# Patient Record
Sex: Female | Born: 1975 | Race: Black or African American | Hispanic: No
Health system: Midwestern US, Community
[De-identification: ages and names within clinical notes are randomized; demographics above are authoritative.]

## PROBLEM LIST (undated history)

## (undated) DIAGNOSIS — D573 Sickle-cell trait: Secondary | ICD-10-CM

---

## 2018-04-21 NOTE — ED Notes (Signed)
ED Triage Note       ED Secondary Triage Entered On:  04/21/2018 14:46 EST    Performed On:  04/21/2018 14:45 EST by Pleas Patricia, RN, Lynnda Shields               General Information   Barriers to Learning :   None evident   Last Tetanus :   Greater than 5 years   Influenza Vaccine Status :   Not received   ED Home Meds Section :   Document assessment   [facility] ED Fall Risk Section :   Document assessment   Infectious Disease Documentation :   Document assessment   ED Advance Directives Section :   Document assessment   Pleas Patricia RN, Lynnda Shields - 04/21/2018 14:45 EST   (As Of: 04/21/2018 14:46:11 EST)   Problems(Active)    Sickle cell trait (SNOMED CT  :20100712 )  Name of Problem:   Sickle cell trait ; Recorder:   Patrick North; Confirmation:   Confirmed ; Classification:   Patient Stated ; Code:   [id number] ; Contributor System:   Dietitian ; Last Updated:   04/21/2018 13:40 EST ; Life Cycle Date:   04/21/2018 ; Life Cycle Status:   Active ; Vocabulary:   SNOMED CT          Diagnoses(Active)    Leg pain-swelling  Date:   04/21/2018 ; Diagnosis Type:   Reason For Visit ; Confirmation:   Complaint of ; Clinical Dx:   Leg pain-swelling ; Classification:   Medical ; Clinical Service:   Emergency medicine ; Code:   PNED ; Probability:   0 ; Diagnosis Code:   [id number]             -    Procedure History   (As Of: 04/21/2018 14:46:11 EST)     [facility] Perch Fall Risk Assessment Tool   Hx of falling last 3 months ED Fall :   No   [place]herton, Dorothy Spark - 04/21/2018 14:45 EST   ED Advance Directive   Advance Directive :   No   [place], RN, Lynnda Shields - 04/21/2018 14:45 EST   ID Risk Screen Symptoms   Recent Travel History :   No recent travel   TB Symptom Screen :   No symptoms   C. diff Symptom/History ID :   Neither of the above   Pillager, Dorothy Spark - 04/21/2018 14:45 EST   Med Hx   Medication List   (As Of: 04/21/2018 14:46:11 EST)

## 2018-04-21 NOTE — Discharge Summary (Signed)
ED Clinical Summary                     [facility] [place]  [address]  [place], [place] [id number]  [phone]          PERSON INFORMATION  Name: [name] [name], [name] [name] Age:  [age] Years DOB: [birth year]   Sex: Female Language: English PCP: PCP,  NONE   Marital Status: Single Phone: [phone] Med Service: MED-Medicine   MRN: [id number] Acct# [id number] Arrival: 04/21/2018 13:25:00   Visit Reason: Leg pain-swelling;  L LEG PAIN/SWELLING Acuity: 4 LOS: 000 02:58   [address]    [address] [place] [id number]   Diagnosis:    Muscle tear  Medications:    Medications Administered During Visit:              Allergies      No Known Medication Allergies      Major Tests and Procedures:  The following procedures and tests were performed during your ED visit.  COMMON PROCEDURES%>  COMMON PROCEDURES COMMENTS%>                PROVIDER INFORMATION               Provider Role Assigned Irven CoeUnassigned   MAFFEO-PA-C, HEATHER N ED MidLevel 04/21/2018 14:37:40        Attending Physician:  MAFFEO-PA-C,  HEATHER N      Admit Doc  MAFFEO-PA-C,  HEATHER N     Consulting Doc       VITALS INFORMATION  Vital Sign Triage Latest   Temp Oral ORAL_1%> ORAL%>   Temp Temporal TEMPORAL_1%> TEMPORAL%>   Temp Intravascular INTRAVASCULAR_1%> INTRAVASCULAR%>   Temp Axillary AXILLARY_1%> AXILLARY%>   Temp Rectal RECTAL_1%> RECTAL%>   02 Sat 98 % 98 %   Respiratory Rate RATE_1%> RATE%>   Peripheral Pulse Rate PULSE RATE_1%> PULSE RATE%>   Apical Heart Rate HEART RATE_1%> HEART RATE%>   Blood Pressure BLOOD PRESSURE_1%>/ BLOOD PRESSURE_1%>83 mmHg BLOOD PRESSURE%> / BLOOD PRESSURE%>83 mmHg                 Immunizations      No Immunizations Documented This Visit          DISCHARGE INFORMATION   Discharge Disposition: H Outpt-Sent Home   Discharge Location:  Home   Discharge Date and Time:  04/21/2018 16:23:16   ED Checkout Date and Time:  04/21/2018 16:23:16     DEPART REASON INCOMPLETE INFORMATION               Depart Action  Incomplete Reason   Interactive View/I&O Recently assessed               Problems      No Problems Documented              Smoking Status      No Smoking Status Documented         PATIENT EDUCATION INFORMATION  Instructions:     Form - Return To Work Social worker(Custom); Muscle Strain, Easy-to-Read     Follow up:                   With: Address: When:   Follow up with primary care provider  Within 1 week   Comments:   Take over-the-counter ibuprofen or Aleve as directed for pain   Ice to affected area   elevation and compression stockings  Follow up with your primary  care doctor call [phone] for referral  Return to the emergency department for any new, worsening or concerning symptoms              ED PROVIDER DOCUMENTATION     Patient:   [name] [name], [name] [name]             MRN: [id number]            FIN: [id number]               Age:   43 years     Sex:  Female     DOB:  February 01, 1976   Associated Diagnoses:   Muscle tear   Author:   MAFFEO-PA-C,  Ledell Peoples      Basic Information   Time seen: Provider Seen (ST)   ED Provider/Time:    MAFFEO-PA-C,  HEATHER N / 04/21/2018 14:37  .   Additional information: Chief Complaint from Nursing Triage Note   Chief Complaint  Chief Complaint: pt c/o swelling, tightness and burning in L calf and knee, and tingling has also started in R toes (04/21/18 13:35:00).      History of Present Illness   Pt is a 43 y/o f who presents to the ED with c/o left leg pain. Patient states thatthe symptoms of been going on for several days. She reports significant swelling to the left leg.pain described as a burning to the calf area. There is no inciting injury, fall, trauma. She complains paresthesias to the areas. She has no other complaints at this time..        Review of Systems   Constitutional symptoms:  No fever,    Skin symptoms:  No rash,    ENMT symptoms:  No sore throat,    Respiratory symptoms:  No cough,    Cardiovascular symptoms:  No chest pain,    Gastrointestinal symptoms:  No abdominal pain,     Genitourinary symptoms:  No dysuria,    Musculoskeletal symptoms:  Muscle pain, No back pain,    Neurologic symptoms:  No headache,              Additional review of systems information: All other systems reviewed and otherwise negative.      Health Status   Allergies:    Allergic Reactions (Selected)  No Known Medication Allergies.      Past Medical/ Family/ Social History   Medical history:    No active or resolved past medical history items have been selected or recorded., Reviewed as documented in chart.   Surgical history:    No active procedure history items have been selected or recorded., Reviewed as documented in chart.   Family history: Reviewed as documented in chart.   Social history: Reviewed as documented in chart.   Problem list:    Active Problems (1)  Sickle cell trait   , per nurse's notes.      Physical Examination               Vital Signs   Vital Signs   04/21/2018 13:35 EST Systolic Blood Pressure 133 mmHg    Diastolic Blood Pressure 83 mmHg    Temperature Oral 36.7 degC    Heart Rate Monitored 87 bpm    Respiratory Rate 18 br/min    SpO2 98 %    Measurements   04/21/2018 13:40 EST Body Mass Index est meas 43.62 kg/m2    Body Mass Index Measured 43.62 kg/m2   04/21/2018 13:35 EST Height/Length Measured 157.5 cm  Weight Dosing 108.2 kg    General:  Alert, no acute distress.    Skin:  Warm, dry.    Head:  Normocephalic, atraumatic.    Neck:  Supple.   Eye:  Pupils are equal, round and reactive to light.   Ears, nose, mouth and throat:  Oral mucosa moist.   Cardiovascular:  Regular rate and rhythm, No murmur, Normal peripheral perfusion.    Respiratory:  Lungs are clear to auscultation, respirations are non-labored, breath sounds are equal, Symmetrical chest wall expansion.    Gastrointestinal:  Soft, Nontender.    Back:  Nontender, Normal range of motion.    Musculoskeletal:  Normal ROM, left lower leg has swelling when compared to the right, tenderness on palpation to the left calf, 2+ dorsalis  pedis pulse.    Neurological:  Alert and oriented to person, place, time, and situation, No focal neurological deficit observed.    Psychiatric:  Cooperative, appropriate mood & affect.       Medical Decision Making   Differential Diagnosis:  DVT, cellulitis, muscle strain, knee injury.   Documents reviewed:  Emergency department nurses' notes.   Notes:  patient in the emergency department for evaluation of left knee swelling. Vascular ultrasound negative for acute DVT however technician informs me that it appears the patient has a proximal calf tear. Discussed this with the patient. Recommended compression stockings, Aleve or ibuprofen, ice and follow-up with primary care doctor. She was advised to return to the emergency department for any new or worsening symptoms. Patient is in agreement with this plan of care. They express understanding and have no further questions at this time. Patient is afebrile, nontoxic appearing and in no acute distress. Discharged to home in stable condition..      Impression and Plan   Diagnosis   Muscle tear (ICD10-CM T14.8XXA, Discharge, Medical)   Plan   Condition: Improved.    Disposition: Discharged: Time  04/21/2018 16:06:00, to home.    Patient was given the following educational materials: Muscle Strain, Easy-to-Read, Muscle Strain, Easy-to-Read, Form - Return To Work Social worker).    Follow up with: Follow up with primary care provider Within 1 week Take over-the-counter ibuprofen or Aleve as directed for pain  Ice to affected area  elevation and compression stockings  Follow up with your primary care doctor call [phone] for referral  Return to the emergency department for any new, worsening or concerning symptoms, Follow up with primary care provider Within 1 week Take over-the-counter ibuprofen or Aleve as directed for pain  Ice to affected area  elevation and compression stockings  Follow up with your primary care doctor call [phone] for referral  Return to the  emergency department for any new, worsening or concerning symptoms.    Counseled: I had a detailed discussion with the patient and/or guardian regarding the historical points/exam findings supporting the discharge diagnosis and need for outpatient followup. Discussed the need to return to the ER if symptoms persist/worsen, or for any questions/concerns that arise at home.

## 2018-04-21 NOTE — ED Notes (Signed)
 ED Patient Education Note     Patient Education Materials Follows:  Musculoskeletal     Muscle Strain    A muscle strain (pulled muscle) happens when a muscle is stretched beyond normal length. It happens when a sudden, violent force stretches your muscle too far. Usually, a few of the fibers in your muscle are torn. Muscle strain is common in athletes. Recovery usually takes 1?2 weeks. Complete healing takes 5?6 weeks.       HOME CARE     Follow the PRICE method of treatment to help your injury get better. Do this the first 2?3 days after the injury:    ? Protect. Protect the muscle to keep it from getting injured again.    ? Rest. Limit your activity and rest the injured body part.    ? Ice. Put ice in a plastic bag. Place a towel between your skin and the bag. Then, apply the ice and leave it on from 15?20 minutes each hour. After the third day, switch to moist heat packs.     ? Compression. Use a splint or elastic bandage on the injured area for comfort. Do not put it on too tightly.    ? Elevate. Keep the injured body part above the level of your heart.      Only take medicine as told by your doctor.     Warm up before doing exercise to prevent future muscle strains.    GET HELP IF:     You have more pain or puffiness (swelling) in the injured area.     You feel numbness, tingling, or notice a loss of strength in the injured area.     MAKE SURE YOU:     Understand these instructions.      Will watch your condition.     Will get help right away if you are not doing well or get worse.    This information is not intended to replace advice given to you by your health care provider. Make sure you discuss any questions you have with your health care provider.    Document Released: 01/14/2008 Document Revised: 01/25/2013 Document Reviewed: 11/03/2012  Elsevier Interactive Patient Education ?2016 [employer]                 Return to Work  _______[name]_____________________________________was treated at  our facility.  INJURY OR ILLNESS WAS:  _____Work related  __x___Not work related  _____Undetermined if work related  RETURN TO WORK  . Employee may return to work on __1/6/2020__________________.    Show this Return to Work statement to your supervisor at work as soon as possible. Your employer should be aware of your condition and can help with the necessary work activity restrictions. If you wish to return to work sooner than the date that is listed above, or if you have further problems that make it difficult for you to return at that time, please call our clinic or your health care provider.    ___Heather Maffeo PA-C______________________________________  Health Care Provider Name (printed)  _________________________________________  Health Care Provider (signature)   ______1/2/2020__________________________________  Date  This information is not intended to replace advice given to you by your health care provider. Make sure you discuss any questions you have with your health care provider.  Document Released: 04/06/2005 Document Revised: 04/27/2014 Document Reviewed: 11/03/2013  Elsevier Interactive Patient Education [employer]

## 2018-04-21 NOTE — ED Provider Notes (Signed)
General Medical Problem *ED        Patient:   [name] [name], [name] [name]             MRN: [id number]            FIN: [id number]               Age:   43 years     Sex:  Female     DOB:  25-Feb-1976   Associated Diagnoses:   Muscle tear   Author:   Ladina Shutters-PA-C,  Ledell Peoples      Basic Information   Time seen: Provider Seen (ST)   ED Provider/Time:    Rhoda Waldvogel-PA-C,  Sohrab Keelan N / 04/21/2018 14:37  .   Additional information: Chief Complaint from Nursing Triage Note   Chief Complaint  Chief Complaint: pt c/o swelling, tightness and burning in L calf and knee, and tingling has also started in R toes (04/21/18 13:35:00).      History of Present Illness   Pt is a 43 y/o f who presents to the ED with c/o left leg pain. Patient states thatthe symptoms of been going on for several days. She reports significant swelling to the left leg.pain described as a burning to the calf area. There is no inciting injury, fall, trauma. She complains paresthesias to the areas. She has no other complaints at this time..        Review of Systems   Constitutional symptoms:  No fever,    Skin symptoms:  No rash,    ENMT symptoms:  No sore throat,    Respiratory symptoms:  No cough,    Cardiovascular symptoms:  No chest pain,    Gastrointestinal symptoms:  No abdominal pain,    Genitourinary symptoms:  No dysuria,    Musculoskeletal symptoms:  Muscle pain, No back pain,    Neurologic symptoms:  No headache,              Additional review of systems information: All other systems reviewed and otherwise negative.      Health Status   Allergies:    Allergic Reactions (Selected)  No Known Medication Allergies.      Past Medical/ Family/ Social History   Medical history:    No active or resolved past medical history items have been selected or recorded., Reviewed as documented in chart.   Surgical history:    No active procedure history items have been selected or recorded., Reviewed as documented in chart.   Family history: Reviewed as documented in chart.   Social  history: Reviewed as documented in chart.   Problem list:    Active Problems (1)  Sickle cell trait   , per nurse's notes.      Physical Examination               Vital Signs   Vital Signs   04/21/2018 13:35 EST Systolic Blood Pressure 133 mmHg    Diastolic Blood Pressure 83 mmHg    Temperature Oral 36.7 degC    Heart Rate Monitored 87 bpm    Respiratory Rate 18 br/min    SpO2 98 %    Measurements   04/21/2018 13:40 EST Body Mass Index est meas 43.62 kg/m2    Body Mass Index Measured 43.62 kg/m2   04/21/2018 13:35 EST Height/Length Measured 157.5 cm    Weight Dosing 108.2 kg    General:  Alert, no acute distress.    Skin:  Warm, dry.    Head:  Normocephalic, atraumatic.    Neck:  Supple.   Eye:  Pupils are equal, round and reactive to light.   Ears, nose, mouth and throat:  Oral mucosa moist.   Cardiovascular:  Regular rate and rhythm, No murmur, Normal peripheral perfusion.    Respiratory:  Lungs are clear to auscultation, respirations are non-labored, breath sounds are equal, Symmetrical chest wall expansion.    Gastrointestinal:  Soft, Nontender.    Back:  Nontender, Normal range of motion.    Musculoskeletal:  Normal ROM, left lower leg has swelling when compared to the right, tenderness on palpation to the left calf, 2+ dorsalis pedis pulse.    Neurological:  Alert and oriented to person, place, time, and situation, No focal neurological deficit observed.    Psychiatric:  Cooperative, appropriate mood & affect.       Medical Decision Making   Differential Diagnosis:  DVT, cellulitis, muscle strain, knee injury.   Documents reviewed:  Emergency department nurses' notes.   Notes:  patient in the emergency department for evaluation of left knee swelling. Vascular ultrasound negative for acute DVT however technician informs me that it appears the patient has a proximal calf tear. Discussed this with the patient. Recommended compression stockings, Aleve or ibuprofen, ice and follow-up with primary care doctor. She was  advised to return to the emergency department for any new or worsening symptoms. Patient is in agreement with this plan of care. They express understanding and have no further questions at this time. Patient is afebrile, nontoxic appearing and in no acute distress. Discharged to home in stable condition..      Impression and Plan   Diagnosis   Muscle tear (ICD10-CM T14.8XXA, Discharge, Medical)   Plan   Condition: Improved.    Disposition: Discharged: Time  04/21/2018 16:06:00, to home.    Patient was given the following educational materials: Muscle Strain, Easy-to-Read, Muscle Strain, Easy-to-Read, Form - Return To Work Social worker).    Follow up with: Follow up with primary care provider Within 1 week Take over-the-counter ibuprofen or Aleve as directed for pain  Ice to affected area  elevation and compression stockings  Follow up with your primary care doctor call [phone] for referral  Return to the emergency department for any new, worsening or concerning symptoms, Follow up with primary care provider Within 1 week Take over-the-counter ibuprofen or Aleve as directed for pain  Ice to affected area  elevation and compression stockings  Follow up with your primary care doctor call [phone] for referral  Return to the emergency department for any new, worsening or concerning symptoms.    Counseled: I had a detailed discussion with the patient and/or guardian regarding the historical points/exam findings supporting the discharge diagnosis and need for outpatient followup. Discussed the need to return to the ER if symptoms persist/worsen, or for any questions/concerns that arise at home.    Signature Line     Electronically Signed on 04/21/2018 04:08 PM EST   ________________________________________________   Fruma [place]-PA-C,  Ledell Peoples      Electronically Signed on 04/21/2018 11:16 PM EST   ________________________________________________   PRICE-MD,  Kirke Corin

## 2018-04-21 NOTE — ED Notes (Signed)
 ED Patient Summary       ;      [facility] Emergency Department  [address], [place] [address]  [phone]  Discharge Instructions (Patient)  _______________________________________    Name: [name] , [name]  DOB: 09-15-1975 MRN: [id number] FIN: NBR%[phone]  Reason For Visit: Leg pain-swelling;  L LEG PAIN/SWELLING  Final Diagnosis: Muscle tear    Visit Date: 04/21/2018 13:25:00  Address: [address] [place] [address]  Phone: [phone]    Primary Care Provider:  Name: PCP,  NONE  Phone:      Emergency Department Providers:         Primary Physician:           [facility] would like to thank you for allowing us  to assist you with your healthcare needs. The following includes patient education materials and information regarding your injury/illness.    Follow-up Instructions: You were treated today on an emergency basis, it may be wise to contact your primary care provider to notify them of your visit today. You may have been referred to your regular doctor or a specialist, please follow up as instructed. If your condition worsens or you can't get in to see the doctor, contact the Emergency Department.              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  INJURY OR ILLNESS WAS:  _____Work related  __x___Not work related  _____Undetermined if work related  RETURN TO WORK  . Employee may return to work on  __1/6/2020__________________.    Show this Return to Work statement to your supervisor at work as soon as possible. Your employer should be aware of your condition and can help with the necessary work activity restrictions. If you wish to return to work sooner than the date that is listed above, or if you have further problems that make it difficult for you to return at that time, please call our clinic or your health care provider.    ___Heather Maffeo PA-C______________________________________  Health Care Provider Name (printed)  _________________________________________  Health Care Provider (signature)   ______1/2/2020__________________________________  Date  This information is not intended to replace advice given to you by your health care provider. Make sure you discuss any questions you have with your health care provider.  Document Released: 04/06/2005 Document Revised: 04/27/2014 Document Reviewed: 11/03/2013  Elsevier Interactive Patient Education 2016 [employer]         Muscle Strain    A muscle strain (pulled muscle) happens when a muscle is stretched beyond normal length. It happens when a sudden, violent force stretches your muscle too far. Usually, a few of the fibers in your muscle are torn. Muscle strain is common in athletes. Recovery usually takes 1?2 weeks. Complete healing takes 5?6 weeks.       HOME CARE     Follow  the PRICE method of treatment to help your injury get better. Do this the first 2?3 days after the injury:    ? Protect. Protect the muscle to keep it from getting injured again.    ? Rest. Limit your activity and rest the injured body part.    ? Ice. Put ice in a plastic bag. Place a towel between your skin and the bag. Then, apply the ice and leave it on from 15?20 minutes each hour. After the third day, switch to moist heat packs.     ? Compression. Use a splint or elastic bandage on the injured area for comfort. Do not put it on too tightly.    ? Elevate. Keep the injured  body part above the level of your heart.      Only take medicine as told by your doctor.     Warm up before doing exercise to prevent future muscle strains.    GET HELP IF:     You have more pain or puffiness (swelling) in the injured area.     You feel numbness, tingling, or notice a loss of strength in the injured area.     MAKE SURE YOU:     Understand these instructions.      Will watch your condition.     Will get help right away if you are not doing well or get worse.    This information is not intended to replace advice given to you by your health care provider. Make sure you discuss any questions you have with your health care provider.    Document Released: 01/14/2008 Document Revised: 01/25/2013 Document Reviewed: 11/03/2012  Elsevier Interactive Patient Education ?2016 Elsevier Inc.        Allergy Info: No Known Medication Allergies    Medication Information:  [facility] provided you with a complete list of medications post discharge, if you have been instructed to stop taking a medication please ensure you also follow up with this information to your Primary Care Physician. Unless otherwise noted, patient will continue to take medications as prescribed prior to the Emergency Room visit. Any specific questions regarding your chronic medications and dosages should be discussed with your physician(s) and pharmacist.    Medications Administered During Visit:      Major Tests and Procedures:  The following procedures and tests were performed during your ED visit.  PROCEDURES%>  PROCEDURES COMMENTS%>          ---------------------------------------------------------------------------------------------------------------------  [facility] allows you to manage your health, view your test results, and retrieve your discharge documents from your hospital stay securely and conveniently from your computer.    To begin the enrollment process, visit [email or web address] Click on "Sign up now"  under [facility].      Comment:

## 2018-04-21 NOTE — ED Notes (Signed)
ED Triage Note       ED Triage Adult Entered On:  04/21/2018 13:40 EST    Performed On:  04/21/2018 13:35 EST by Shirlean Kelly H               Triage   Chief Complaint :   pt c/o swelling, tightness and burning in L calf and knee, and tingling has also started in R toes   Numeric Rating Pain Scale :   0 = No pain   [place] Mode of Arrival :   Private vehicle   Temperature Oral :   36.7 degC(Converted to: 98.1 degF)    Heart Rate Monitored :   87 bpm   Respiratory Rate :   18 br/min   Systolic Blood Pressure :   133 mmHg   Diastolic Blood Pressure :   83 mmHg   SpO2 :   98 %   Oxygen Therapy :   Room air   Patient presentation :   None of the above   Chief Complaint or Presentation suggest infection :   No   Weight Dosing :   108.2 kg(Converted to: 238 lb 9 oz)    Height :   157.5 cm(Converted to: 5 ft 2 in)    Body Mass Index Dosing :   44 kg/m2   ED General Section :   Document assessment   Pregnancy Status :   Patient denies   Patrick North - 04/21/2018 13:35 EST   DCP GENERIC CODE   Tracking Acuity :   4   Tracking Group :   ED [id number] [employer]   Patrick North - 04/21/2018 13:35 EST   Last Menstrual Period :   04/16/2018 EST   ED Allergies Section :   Document assessment   ED Reason for Visit Section :   Document assessment   Patrick North - 04/21/2018 13:35 EST   Allergies   (As Of: 04/21/2018 13:40:33 EST)   Allergies (Active)   No Known Medication Allergies  Estimated Onset Date:   Unspecified ; Created By:   Patrick North; Reaction Status:   Active ; Category:   Drug ; Substance:   No Known Medication Allergies ; Type:   Allergy ; Updated By:   Patrick North; Reviewed Date:   04/21/2018 13:39 EST        Psycho-Social   Last 3 mo, thoughts killing self/others :   Patient denies   Patrick North - 04/21/2018 13:35 EST   ED Reason for Visit   (As Of: 04/21/2018 13:40:33 EST)   Problems(Active)    Sickle cell trait (SNOMED CT  :27517001 )  Name of Problem:   Sickle cell trait ; Recorder:   Patrick North;  Confirmation:   Confirmed ; Classification:   Patient Stated ; Code:   [id number] ; Contributor System:   Dietitian ; Last Updated:   04/21/2018 13:40 EST ; Life Cycle Date:   04/21/2018 ; Life Cycle Status:   Active ; Vocabulary:   SNOMED CT          Diagnoses(Active)    Leg pain-swelling  Date:   04/21/2018 ; Diagnosis Type:   Reason For Visit ; Confirmation:   Complaint of ; Clinical Dx:   Leg pain-swelling ; Classification:   Medical ; Clinical Service:   Emergency medicine ; Code:   PNED ; Probability:   0 ; Diagnosis  Code:   [id number] EE

## 2020-06-26 ENCOUNTER — Encounter (HOSPITAL_COMMUNITY): Payer: Self-pay | Admitting: Emergency Medicine

## 2020-06-26 ENCOUNTER — Other Ambulatory Visit: Payer: Self-pay

## 2020-06-26 ENCOUNTER — Emergency Department (HOSPITAL_COMMUNITY)
Admission: EM | Admit: 2020-06-26 | Discharge: 2020-06-26 | Disposition: A | Discharge status: Home / Self Care | Payer: Self-pay | Attending: Emergency Medicine | Admitting: Emergency Medicine

## 2020-06-26 ENCOUNTER — Emergency Department (HOSPITAL_COMMUNITY): Payer: Self-pay

## 2020-06-26 DIAGNOSIS — M25532 Pain in left wrist: Secondary | ICD-10-CM | POA: Insufficient documentation

## 2020-06-26 DIAGNOSIS — F17210 Nicotine dependence, cigarettes, uncomplicated: Secondary | ICD-10-CM | POA: Insufficient documentation

## 2020-06-26 HISTORY — DX: Sickle-cell trait: D57.3

## 2020-06-26 MED ORDER — IBUPROFEN 400 MG PO TABS
600.0000 mg | ORAL_TABLET | Freq: Once | ORAL | Status: AC
  Administered 2020-06-26: 600 mg via ORAL
  Filled 2020-06-26: qty 2

## 2020-06-26 NOTE — ED Triage Notes (Addendum)
Pt to the ED with c/o Left wrist pain.

## 2020-06-26 NOTE — ED Provider Notes (Signed)
[facility] EMERGENCY DEPARTMENT Provider Note   CSN: [id number] Arrival date & time: 06/26/20  1211     History Chief Complaint  Patient presents with  . Wrist Pain    Holly Rowe is a 45 y.o. female.  Holly Rowe is a 45 y.o. female with a history of sickle cell trait, who presents to the ED for evaluation of left wrist pain.  Patient states that a few weeks ago she was living with a woman who had fallen, she tried to help her up they both fell back to the floor and she thinks she may have landed funny on her wrist or gotten her wrist caught underneath her.  Since then she has been having pain in the left wrist, with time it was starting to slowly improve, but 3 days ago she moved back in with her daughter who has 2 small children that she has been helping with.  She has been picking them up and helping care for them and with that has started to notice the pain in her left wrist has gotten a lot worse again.  It is worse with movement, primarily flexion or extension and last night while she was sleeping she thinks she wants to move her wrist in a certain way and it caused a sharp pain that woke her from sleep.  She has not noticed any swelling or skin changes.  No numbness tingling or weakness.  Today she wrapped it with an Ace wrap for the first time to provide some support.  Tried 1 dose of 400 mg ibuprofen a few days ago with minimal relief, has not tried anything else to treat symptoms.  No other aggravating relieving factors.        Past Medical History:  Diagnosis Date  . Sickle cell trait (HCC)     There are no problems to display for this patient.   History reviewed. No pertinent surgical history.   OB History   No obstetric history on file.     History reviewed. No pertinent family history.  Social History   Tobacco Use  . Smoking status: Current Every Day Smoker    Packs/day: 0.50    Types: Cigarettes  . Smokeless tobacco: Never Used  Vaping  Use  . Vaping Use: Never used  Substance Use Topics  . Alcohol use: Not Currently  . Drug use: Not Currently    Home Medications Prior to Admission medications   Not on File    Allergies    Patient has no allergy information on record.  Review of Systems   Review of Systems  Constitutional: Negative for chills and fever.  Musculoskeletal: Positive for arthralgias. Negative for joint swelling.  Skin: Negative for color change and wound.  Neurological: Negative for weakness and numbness.    Physical Exam Updated Vital Signs BP (!) 122/50 (BP Location: Right Arm)   Temp 98.3 F (36.8 C) (Oral)   Resp 17   Ht 5\' 1"  (1.549 m)   Wt 112.3 kg   LMP  (LMP Unknown)   SpO2 100%   BMI 46.76 kg/m   Physical Exam Vitals and nursing note reviewed.  Constitutional:      General: She is not in acute distress.    Appearance: Normal appearance. She is well-developed and well-nourished. She is not diaphoretic.     Comments: Well-appearing and in no distress   HENT:     Head: Normocephalic and atraumatic.  Eyes:     General:  Right eye: No discharge.        Left eye: No discharge.  Pulmonary:     Effort: Pulmonary effort is normal. No respiratory distress.  Musculoskeletal:        General: Tenderness present. No swelling.     Comments: Tenderness present over the radial aspect of the left wrist with no palpable swelling or deformity, no overlying erythema or warmth.  Patient able to move the thumb and all fingers without worsening of pain, pain worsened with flexion or extension of the wrist as well as rotation.  2+ radial pulse and good cap refill.  No tenderness or pain with range of motion of the elbow.  Normal sensation and strength.  Skin:    General: Skin is warm and dry.  Neurological:     Mental Status: She is alert and oriented to person, place, and time.     Coordination: Coordination normal.  Psychiatric:        Mood and Affect: Mood and affect and mood normal.         Behavior: Behavior normal.     ED Results / Procedures / Treatments   Labs (all labs ordered are listed, but only abnormal results are displayed) Labs Reviewed - No data to display  EKG None  Radiology DG Wrist Complete Left  Result Date: 06/26/2020 CLINICAL DATA:  Fall.  Wrist pain. EXAM: LEFT WRIST - COMPLETE 3+ VIEW COMPARISON:  None. FINDINGS: There is no evidence of fracture or dislocation. There is no evidence of arthropathy or other focal bone abnormality. Soft tissues are unremarkable. IMPRESSION: Negative left wrist radiographs. Electronically Signed   By: Marin Roberts M.D.   On: 06/26/2020 14:38    Procedures Procedures   Medications Ordered in ED Medications  ibuprofen (ADVIL) tablet 600 mg (600 mg Oral Given 06/26/20 1338)    ED Course  I have reviewed the triage vital signs and the nursing notes.  Pertinent labs & imaging results that were available during my care of the patient were reviewed by me and considered in my medical decision making (see chart for details).    MDM Rules/Calculators/A&P                         Patient X-Ray negative for obvious fracture or dislocation. Suspect wrist sprain. Pain managed in ED. Pt advised to follow up with orthopedics if symptoms persist. Patient given brace while in ED, conservative therapy recommended and discussed. Patient will be dc home & is agreeable with above plan.  Final Clinical Impression(s) / ED Diagnoses Final diagnoses:  Left wrist pain    Rx / DC Orders ED Discharge Orders    None       Legrand Rams 06/26/20 1628    Vanetta Mulders, MD 07/09/20 [phone]

## 2020-06-26 NOTE — Discharge Instructions (Signed)
Your x-ray looks good, suspect you have a wrist sprain that was healing and now worsened after increased activity with picking up your grandchildren.  Take naproxen (Aleve) every 12 hours to help with pain and inflammation, you can use Tylenol in addition to this.  You can also apply ice.  Use wrist brace at all times, even when sleeping.  Follow-up with orthopedics if pain is not improving.

## 2022-07-02 IMAGING — DX DG WRIST COMPLETE 3+V*L*
4 series · 4 of 4 positions shown · non-contrast
Comparison: None.

CLINICAL DATA: Fall.  Wrist pain.

EXAM:
LEFT WRIST - COMPLETE 3+ VIEW

[wrist ap (1 of 2)]
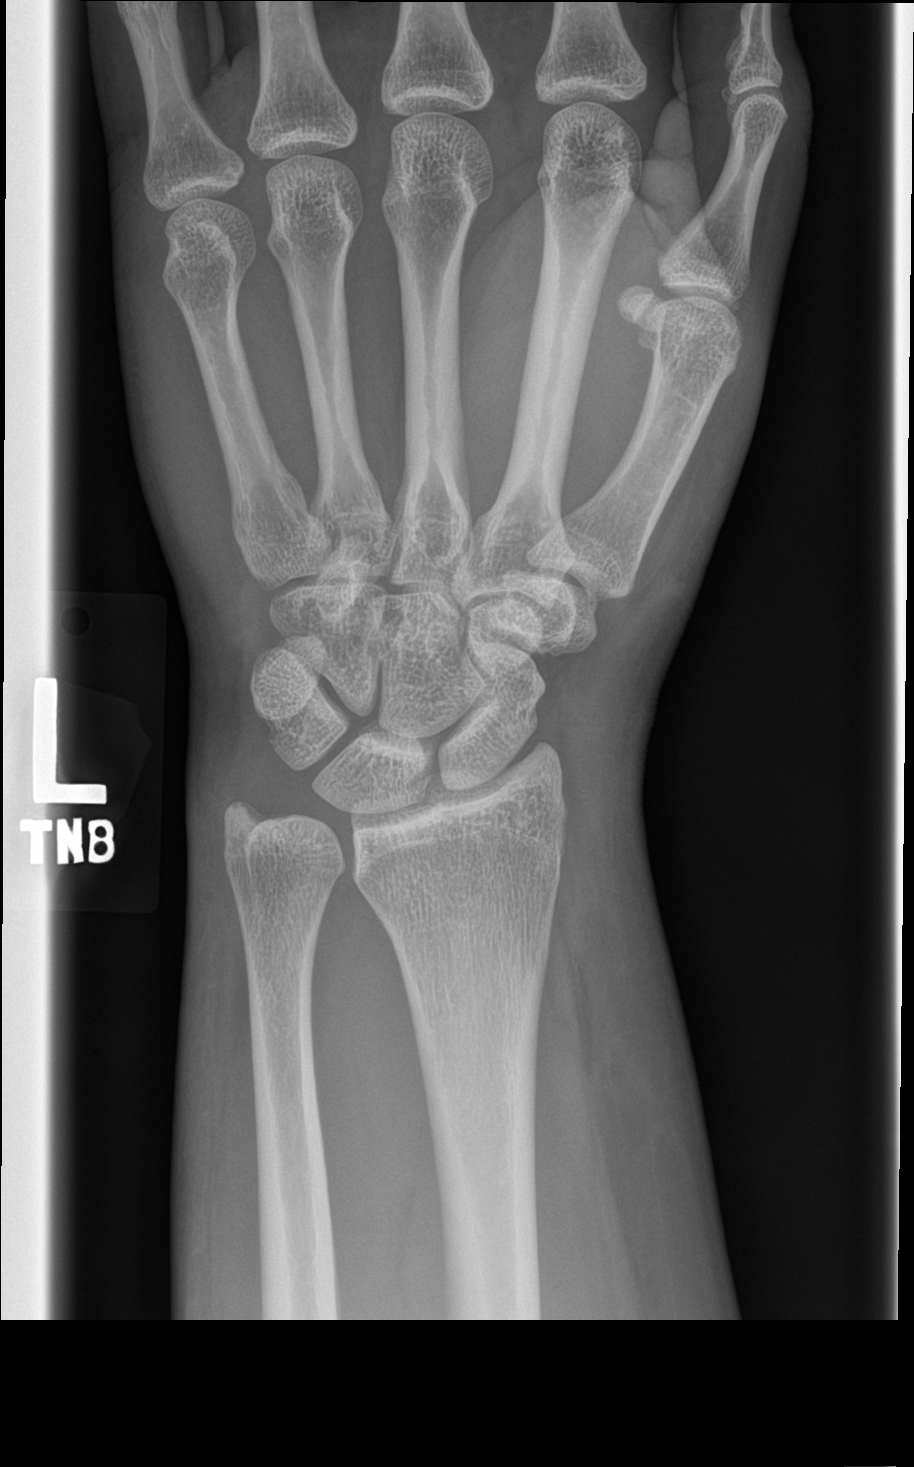

[wrist obl]
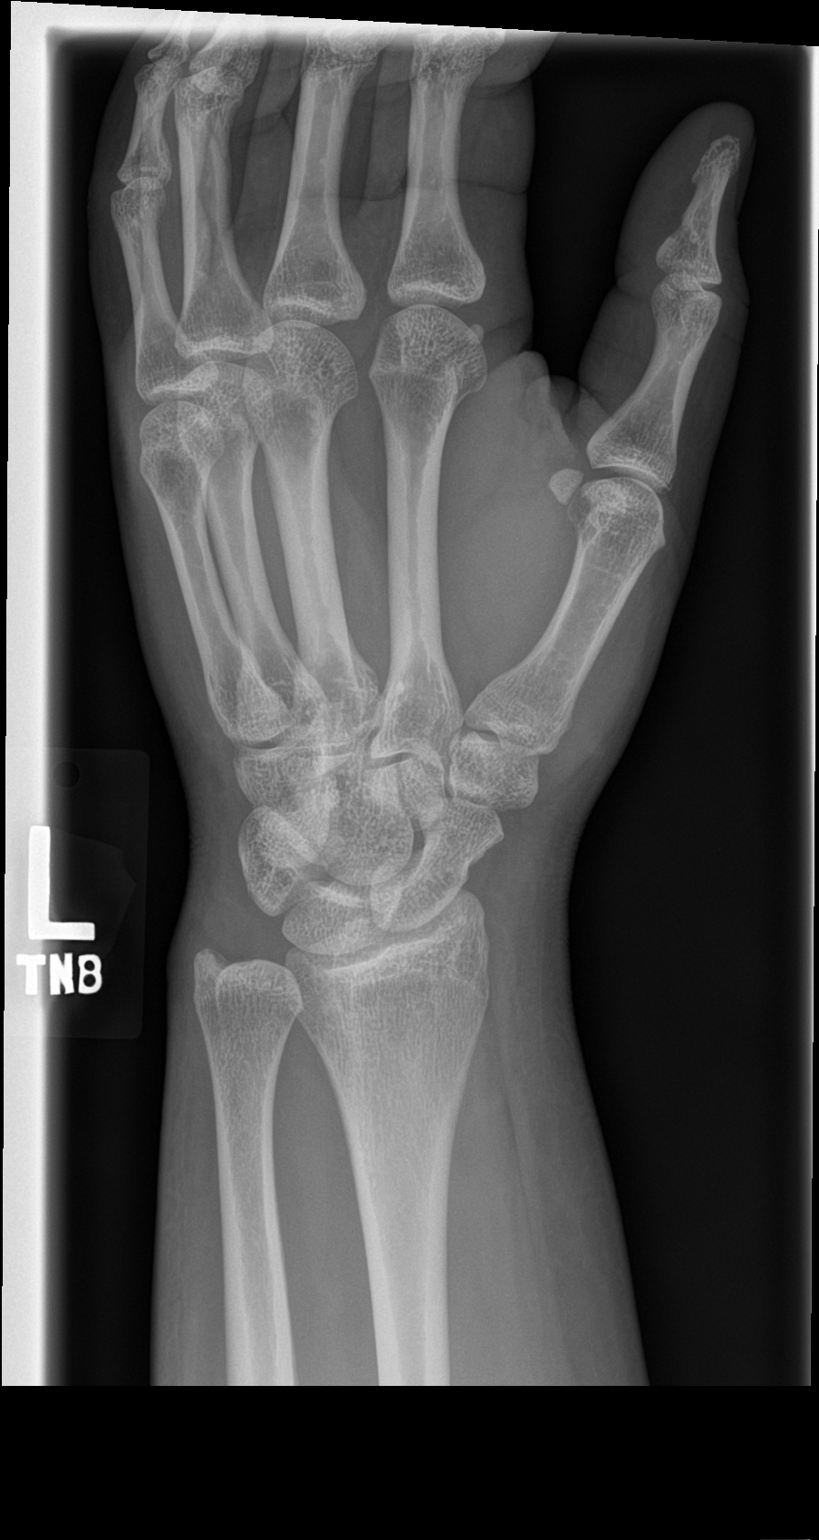

[wrist tunnel]
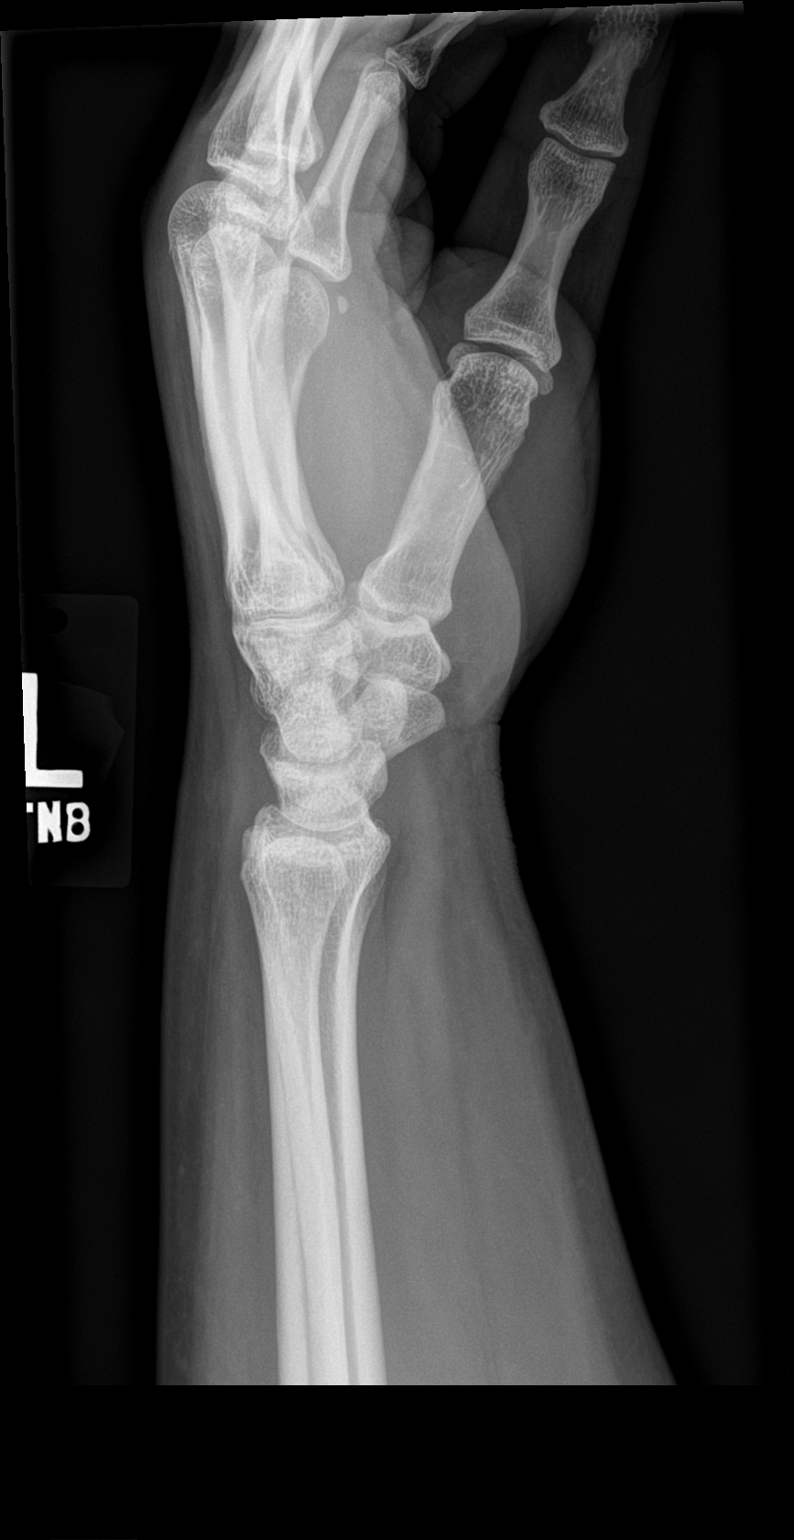

[wrist ap (2 of 2)]
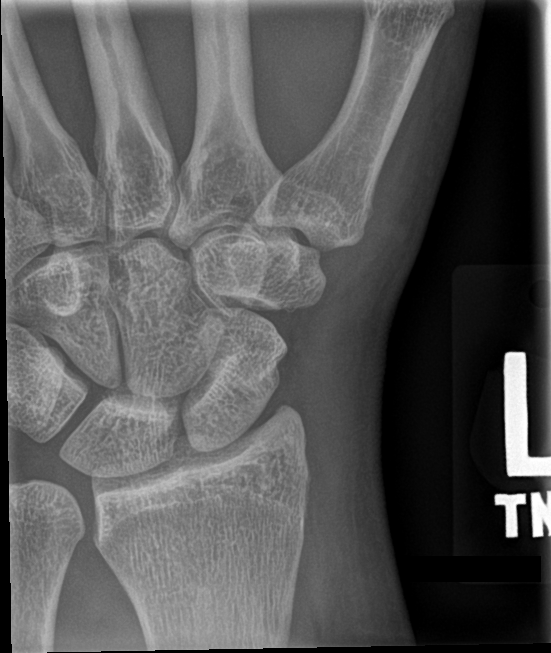

[4 of 4 positions shown; findings below may reference images not displayed]

FINDINGS: There is no evidence of fracture or dislocation. There is no
evidence of arthropathy or other focal bone abnormality. Soft
tissues are unremarkable.
IMPRESSION: Negative left wrist radiographs.
